# Patient Record
Sex: Male | Born: 1970 | Race: White | Hispanic: No | Marital: Married | Smoking: Never smoker
Health system: Southern US, Community
[De-identification: ages and names within clinical notes are randomized; demographics above are authoritative.]

## PROBLEM LIST (undated history)

## (undated) DIAGNOSIS — D099 Carcinoma in situ, unspecified: Secondary | ICD-10-CM

## (undated) DIAGNOSIS — C801 Malignant (primary) neoplasm, unspecified: Secondary | ICD-10-CM

## (undated) HISTORY — PX: KNEE SURGERY: SHX244

## (undated) HISTORY — PX: PARTIAL GLOSSECTOMY: SHX2173

---

## 2000-02-25 ENCOUNTER — Encounter: Payer: Self-pay | Admitting: Emergency Medicine

## 2000-02-25 ENCOUNTER — Emergency Department (HOSPITAL_COMMUNITY): Admission: EM | Admit: 2000-02-25 | Discharge: 2000-02-25 | Payer: Self-pay | Admitting: Emergency Medicine

## 2000-11-18 ENCOUNTER — Emergency Department (HOSPITAL_COMMUNITY): Admission: EM | Admit: 2000-11-18 | Discharge: 2000-11-18 | Payer: Self-pay | Admitting: Emergency Medicine

## 2006-06-20 ENCOUNTER — Emergency Department (HOSPITAL_COMMUNITY): Admission: EM | Admit: 2006-06-20 | Discharge: 2006-06-20 | Payer: Self-pay | Admitting: Emergency Medicine
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
## 2014-11-20 ENCOUNTER — Emergency Department (HOSPITAL_COMMUNITY)
Admission: EM | Admit: 2014-11-20 | Discharge: 2014-11-20 | Disposition: A | Discharge status: Home / Self Care | Payer: Worker's Compensation | Attending: Emergency Medicine | Admitting: Emergency Medicine

## 2014-11-20 ENCOUNTER — Encounter (HOSPITAL_COMMUNITY): Payer: Self-pay | Admitting: *Deleted

## 2014-11-20 DIAGNOSIS — W273XXA Contact with needle (sewing), initial encounter: Secondary | ICD-10-CM | POA: Insufficient documentation

## 2014-11-20 DIAGNOSIS — IMO0001 Reserved for inherently not codable concepts without codable children: Secondary | ICD-10-CM

## 2014-11-20 DIAGNOSIS — Y998 Other external cause status: Secondary | ICD-10-CM | POA: Diagnosis not present

## 2014-11-20 DIAGNOSIS — S61031A Puncture wound without foreign body of right thumb without damage to nail, initial encounter: Secondary | ICD-10-CM | POA: Insufficient documentation

## 2014-11-20 DIAGNOSIS — S6981XA Other specified injuries of right wrist, hand and finger(s), initial encounter: Secondary | ICD-10-CM

## 2014-11-20 DIAGNOSIS — Y9389 Activity, other specified: Secondary | ICD-10-CM | POA: Diagnosis not present

## 2014-11-20 DIAGNOSIS — Z7721 Contact with and (suspected) exposure to potentially hazardous body fluids: Secondary | ICD-10-CM | POA: Diagnosis present

## 2014-11-20 DIAGNOSIS — Y9289 Other specified places as the place of occurrence of the external cause: Secondary | ICD-10-CM | POA: Insufficient documentation

## 2014-11-20 LAB — RAPID HIV SCREEN (HIV 1/2 AB+AG)
HIV 1/2 Antibodies: NONREACTIVE
HIV-1 P24 Antigen - HIV24: NONREACTIVE
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
## 2014-11-21 LAB — HEPATITIS PANEL, ACUTE
HCV Ab: <0.1 s/co ratio (ref 0.0–0.9)
Hep A IgM: NEGATIVE
Hep B C IgM: NEGATIVE
Hepatitis B Surface Ag: NEGATIVE

## 2017-12-23 ENCOUNTER — Emergency Department (HOSPITAL_COMMUNITY)
Admission: EM | Admit: 2017-12-23 | Discharge: 2017-12-23 | Disposition: A | Discharge status: Home / Self Care | Payer: No Typology Code available for payment source | Attending: Emergency Medicine | Admitting: Emergency Medicine

## 2017-12-23 ENCOUNTER — Other Ambulatory Visit: Payer: Self-pay

## 2017-12-23 ENCOUNTER — Encounter (HOSPITAL_COMMUNITY): Payer: Self-pay | Admitting: Emergency Medicine

## 2017-12-23 DIAGNOSIS — S0181XA Laceration without foreign body of other part of head, initial encounter: Secondary | ICD-10-CM | POA: Diagnosis not present

## 2017-12-23 DIAGNOSIS — W2210XA Striking against or struck by unspecified automobile airbag, initial encounter: Secondary | ICD-10-CM | POA: Insufficient documentation

## 2017-12-23 DIAGNOSIS — Y9389 Activity, other specified: Secondary | ICD-10-CM | POA: Diagnosis not present

## 2017-12-23 DIAGNOSIS — Y998 Other external cause status: Secondary | ICD-10-CM | POA: Insufficient documentation

## 2017-12-23 DIAGNOSIS — Z85828 Personal history of other malignant neoplasm of skin: Secondary | ICD-10-CM | POA: Diagnosis not present

## 2017-12-23 DIAGNOSIS — T148XXA Other injury of unspecified body region, initial encounter: Secondary | ICD-10-CM | POA: Insufficient documentation

## 2017-12-23 DIAGNOSIS — S0993XA Unspecified injury of face, initial encounter: Secondary | ICD-10-CM | POA: Diagnosis present

## 2017-12-23 DIAGNOSIS — Y92410 Unspecified street and highway as the place of occurrence of the external cause: Secondary | ICD-10-CM | POA: Diagnosis not present

## 2017-12-23 DIAGNOSIS — S0193XA Puncture wound without foreign body of unspecified part of head, initial encounter: Secondary | ICD-10-CM | POA: Insufficient documentation

## 2017-12-23 HISTORY — DX: Carcinoma in situ, unspecified: D09.9

## 2017-12-23 HISTORY — DX: Malignant (primary) neoplasm, unspecified: C80.1

## 2017-12-23 MED ORDER — BACITRACIN ZINC 500 UNIT/GM EX OINT
TOPICAL_OINTMENT | CUTANEOUS | Status: AC
  Administered 2017-12-23: 1
  Filled 2017-12-23: qty 1.8
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
## 2017-12-23 NOTE — Discharge Instructions (Signed)
You may use over-the-counter Motrin (Ibuprofen), Acetaminophen (Tylenol), topical muscle creams such as SalonPas, Icy Hot, Bengay, etc. Please stretch, apply heat, and have massage therapy for additional assistance. ° °
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
